# Patient Record
Sex: Male | Born: 1985 | Race: Black or African American | Hispanic: No | Smoking: Never smoker
Health system: Southern US, Community
[De-identification: ages and names within clinical notes are randomized; demographics above are authoritative.]

## PROBLEM LIST (undated history)

## (undated) DIAGNOSIS — R7611 Nonspecific reaction to tuberculin skin test without active tuberculosis: Secondary | ICD-10-CM

## (undated) HISTORY — DX: Nonspecific reaction to tuberculin skin test without active tuberculosis: R76.11

---

## 2015-07-30 ENCOUNTER — Telehealth: Payer: Self-pay | Admitting: Behavioral Health

## 2015-07-30 NOTE — Telephone Encounter (Signed)
Unable to reach patient at time of Pre-Visit Call.  Left message for patient to return call when available.    

## 2015-07-31 ENCOUNTER — Ambulatory Visit (INDEPENDENT_AMBULATORY_CARE_PROVIDER_SITE_OTHER): Payer: BLUE CROSS/BLUE SHIELD | Admitting: Medical

## 2015-07-31 ENCOUNTER — Encounter: Payer: Self-pay | Admitting: Medical

## 2015-07-31 ENCOUNTER — Telehealth: Payer: Self-pay | Admitting: Medical

## 2015-07-31 VITALS — BP 102/66 | HR 75 | Temp 98.0°F | Resp 16 | Ht 72.75 in | Wt 177.6 lb

## 2015-07-31 DIAGNOSIS — R7611 Nonspecific reaction to tuberculin skin test without active tuberculosis: Secondary | ICD-10-CM

## 2015-07-31 NOTE — Progress Notes (Signed)
   Subjective:    Patient ID: Jamie Hanson, male    DOB: 03/05/1985, 30 y.o.   MRN: [id number]  HPI  I have reviewed pt PMH, PSH, FH, Social History and Surgical History.   Pt states feels good. No complaints. Pt had positive ppd sept 2016. Pt started with isoniazid since October. Pt will be on for 9 months. [facility]. Pt chest xray was negative. Pt not symptomatic.  Pt states no recent pcp.  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 Pt works at [employer]. He is a Teacher, adult educationweb application developer, Pt works out plays soccer 3 times a week, Pt states moderate healthy diet, separated. No children.     Review of Systems  Constitutional: Negative for fever, chills and fatigue.  Respiratory: Negative for cough, chest tightness, shortness of breath and wheezing.   Cardiovascular: Negative for chest pain and palpitations.  Gastrointestinal: Negative for abdominal pain, blood in stool and anal bleeding.  Genitourinary: Negative for dysuria and flank pain.  Musculoskeletal: Negative for back pain and gait problem.  Skin: Negative for rash.  Neurological: Negative for dizziness and headaches.  Hematological: Negative for adenopathy. Does not bruise/bleed easily.  Psychiatric/Behavioral: Negative for behavioral problems and confusion.     History reviewed. No pertinent past medical history.   Social History   Social History  . Marital Status: Legally Separated    Spouse Name: N/A  . Number of Children: N/A  . Years of Education: N/A   Occupational History  . Not on file.   Social History Main Topics  . Smoking status: Never Smoker   . Smokeless tobacco: Never Used  . Alcohol Use: No  . Drug Use: No  . Sexual Activity: Not on file   Other Topics Concern  . Not on file   Social History Narrative  . No narrative on file    History reviewed. No pertinent past surgical  history.  History reviewed. No pertinent family history.  No Known Allergies  No current outpatient prescriptions on file prior to visit.   No current facility-administered medications on file prior to visit.    BP 102/66 mmHg  Pulse 75  Temp(Src) 98 F (36.7 C) (Oral)  Resp 16  Ht 6' 0.75" (1.848 m)  Wt 177 lb 9.6 oz (80.559 kg)  BMI 23.59 kg/m2  SpO2 99%        Objective:   Physical Exam  General Mental Status- Alert. General Appearance- Not in acute distress.    Neck Carotid Arteries- Normal color. Moisture- Normal Moisture. No carotid bruits. No JVD.  Chest and Lung Exam Auscultation: Breath Sounds:-Normal.  Cardiovascular Auscultation:Rythm- Regular. Murmurs & Other Heart Sounds:Auscultation of the heart reveals- No Murmurs.  Abdomen Inspection:-Inspeection Normal. Palpation/Percussion:Note:No mass. Palpation and Percussion of the abdomen reveal- Non Tender, Non Distended + BS, no rebound or guarding.    Neurologic Cranial Nerve exam:- CN III-XII intact(No nystagmus), symmetric smile. Strength:- 5/5 equal and symmetric strength both upper and lower extremities.      Assessment & Plan:  For your + ppd continue your isoniazid as healthy department has advised.  Regarding your history of vaccines will ask you sign release form and [facility] [place].   When you/we have vaccine records and if you need form filled out for college then schedule complete physical exam.  Follow up as needed

## 2015-07-31 NOTE — Patient Instructions (Signed)
For your + ppd continue your isoniazid as healthy department has advised.  Regarding your history of vaccines will ask you sign release form and [facility] a [facility] [place].   When you/we have vaccine records and if you need form filled out for college then schedule complete physical exam.  Follow up as needed

## 2015-07-31 NOTE — Telephone Encounter (Signed)
F[facility]

## 2015-07-31 NOTE — Progress Notes (Signed)
Pre visit review using our clinic review tool, if applicable. No additional management support is needed unless otherwise documented below in the visit note. 

## 2015-08-01 ENCOUNTER — Telehealth: Payer: Self-pay | Admitting: Medical

## 2015-08-01 NOTE — Telephone Encounter (Signed)
Left message on VM to follow up on New Patient appointment June 27. Welcomed patient to [facility] again and asked for a call back to inform us of his New Patient experience.

## 2015-08-08 NOTE — Telephone Encounter (Signed)
Relation to JY:NWGNpt:self Call back number:[phone] Pharmacy:  Reason for call:  Patient checking on status of immunization records from Clinic listed below. Patient will contact and have them fax records to [phone] Attention Saintclair HalstedJessica G.

## 2015-08-10 ENCOUNTER — Telehealth: Payer: Self-pay | Admitting: *Deleted

## 2015-08-10 NOTE — Telephone Encounter (Signed)
Forwarded to Edward/Holden. JG//CMA 

## 2015-08-14 ENCOUNTER — Telehealth: Payer: Self-pay | Admitting: Medical

## 2015-08-14 NOTE — Telephone Encounter (Signed)
I don't remember reviewing immunization records form . Would you help me find these records. Were they placed in by folders. I will check. But also did pt drop off form to be filled out. I need to review form and vaccines before I can say if he needs to come back in. If we can't find form touch base with Helaine ChessAshlee Langston and Amado CoeJessica Glover. Looks like they have talked with pt.

## 2015-08-14 NOTE — Telephone Encounter (Signed)
Please advise if you have received any paperwork.

## 2015-08-14 NOTE — Telephone Encounter (Signed)
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

## 2015-08-14 NOTE — Telephone Encounter (Signed)
Caller name: Lynnea FerrierSolomon  Relation to pt: Self Call back number: [phone] Pharmacy:  Reason for call: Pt stated is needing immunization records, pt mentioned that his records was sent from another office that he was seen at before [facility]) to our office and is needing copies of immunization for college. Pt will bring in also document during the wk, document is for college to be filled out by PCP. Pt requested if any other procedures needed to be done for document to be filled out to please inform him so he can make appt. Please advise.

## 2015-08-14 NOTE — Telephone Encounter (Signed)
Per chart, Immunization records were forwarded to [place] and his CMA, Francesco RunnerHolden.  Elveria RoyalsJessica A Glover, CMA at 08/10/2015 10:57 AM     Status: Signed       Expand All Collapse All   Forwarded to Luc[employer]/Holden. JG//CMA       Chiropodistmmunization Certificate received and forwarded to [place] to complete.

## 2015-08-15 NOTE — Telephone Encounter (Signed)
All forms were in providers folder for review. JG//CMA

## 2015-08-17 NOTE — Telephone Encounter (Signed)
Spoke with pt and he states that he will need the paperwork filled out before the middle of next week if possible. I informed the pt that we would do our best to get the paperwork completed as soon as possible and I would let him know if there was any more information needed at this time. Pt voices understanding.

## 2015-08-17 NOTE — Telephone Encounter (Signed)
Pt called today to verify status of documents. Pt would like to be called ASAP.

## 2015-08-21 NOTE — Telephone Encounter (Signed)
I spent quite some time going through all his records(spent time on Saturday tyring to figure it out). It took quite some time going through all his notes. I separtated sheets that had vaccines. He needs office visit. CPE type exam. We can explain what it appears is required from the school. If nothing was lacking documentation wise I would have simply filled out forms but that appears to not be the case.

## 2015-08-21 NOTE — Telephone Encounter (Signed)
Patient checking on the status of message below °

## 2015-08-21 NOTE — Telephone Encounter (Signed)
Appointment scheduled for patient tomorrow at 3:4281m.

## 2015-08-21 NOTE — Telephone Encounter (Signed)
Please advise 

## 2015-08-22 ENCOUNTER — Encounter: Payer: Self-pay | Admitting: Medical

## 2015-08-22 ENCOUNTER — Ambulatory Visit (INDEPENDENT_AMBULATORY_CARE_PROVIDER_SITE_OTHER): Payer: BLUE CROSS/BLUE SHIELD | Admitting: Medical

## 2015-08-22 VITALS — BP 100/68 | HR 69 | Temp 98.1°F | Ht 72.75 in | Wt 180.6 lb

## 2015-08-22 DIAGNOSIS — R75 Inconclusive laboratory evidence of human immunodeficiency virus [HIV]: Secondary | ICD-10-CM | POA: Diagnosis not present

## 2015-08-22 DIAGNOSIS — Z23 Encounter for immunization: Secondary | ICD-10-CM | POA: Diagnosis not present

## 2015-08-22 DIAGNOSIS — R1031 Right lower quadrant pain: Secondary | ICD-10-CM | POA: Diagnosis not present

## 2015-08-22 DIAGNOSIS — Z0000 Encounter for general adult medical examination without abnormal findings: Secondary | ICD-10-CM

## 2015-08-22 NOTE — Patient Instructions (Addendum)
For wellness exam I put in future lab order.Can get done this week.  We gave second MMR today to meet 2nd dose requirement.  You had tdap on 10-13-2014. So meet tetanus requirement despite no records of prior tetanus vaccine.  TB skin test info filled out. Recommend get letter from health dept on 08-25-2015. 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 Unclear if [employer] every followed through with referral to ID as they intended per records.  I am giving you copy of all records that I found with vaccine information.  Follow up date to be determined after lab review.   Preventive Care for Adults, Male A healthy lifestyle and preventive care can promote health and wellness. Preventive health guidelines for men include the following key practices:  A routine yearly physical is a good way to check with your health care provider about your health and preventative screening. It is a chance to share any concerns and updates on your health and to receive a thorough exam.  Visit your dentist for a routine exam and preventative care every 6 months. Brush your teeth twice a day and floss once a day. Good oral hygiene prevents tooth decay and gum disease.  The frequency of eye exams is based on your age, health, family medical history, use of contact lenses, and other factors. Follow your health care provider's recommendations for frequency of eye exams.  Eat a healthy diet. Foods such as vegetables, fruits, whole grains, low-fat dairy products, and lean protein foods contain the nutrients you need without too many calories. Decrease your intake of foods high in solid fats, added sugars, and salt. Eat the right amount of calories for you.Get information about a proper diet from your health care provider, if necessary.  Regular physical exercise is one of the most important things you can do  for your health. Most adults should get at least 150 minutes of moderate-intensity exercise (any activity that increases your heart rate and causes you to sweat) each week. In addition, most adults need muscle-strengthening exercises on 2 or more days a week.  Maintain a healthy weight. The body mass index (BMI) is a screening tool to identify possible weight problems. It provides an estimate of body fat based on height and weight. Your health care provider can find your BMI and can help you achieve or maintain a healthy weight.For adults 20 years and older:  A BMI below 18.5 is considered underweight.  A BMI of 18.5 to 24.9 is normal.  A BMI of 25 to 29.9 is considered overweight.  A BMI of 30 and above is considered obese.  Maintain normal blood lipids and cholesterol levels by exercising and minimizing your intake of saturated fat. Eat a balanced diet with plenty of fruit and vegetables. Blood tests for lipids and cholesterol should begin at age 53 and be repeated every 5 years. If your lipid or cholesterol levels are high, you are over 50, or you are at high risk for heart disease, you may need your cholesterol levels checked more frequently.Ongoing high lipid and cholesterol levels should be treated with medicines if diet and exercise are not working.  If you smoke, find out from your health care provider how to quit. If you do not use tobacco, do not start.  Lung cancer screening is recommended for adults aged 58-80 years who are at high risk for developing lung  cancer because of a history of smoking. A yearly low-dose CT scan of the lungs is recommended for people who have at least a 30-pack-year history of smoking and are a current smoker or have quit within the past 15 years. A pack year of smoking is smoking an average of 1 pack of cigarettes a day for 1 year (for example: 1 pack a day for 30 years or 2 packs a day for 15 years). Yearly screening should continue until the smoker has  stopped smoking for at least 15 years. Yearly screening should be stopped for people who develop a health problem that would prevent them from having lung cancer treatment.  If you choose to drink alcohol, do not have more than 2 drinks per day. One drink is considered to be 12 ounces (355 mL) of beer, 5 ounces (148 mL) of wine, or 1.5 ounces (44 mL) of liquor.  Avoid use of street drugs. Do not share needles with anyone. Ask for help if you need support or instructions about stopping the use of drugs.  High blood pressure causes heart disease and increases the risk of stroke. Your blood pressure should be checked at least every 1-2 years. Ongoing high blood pressure should be treated with medicines, if weight loss and exercise are not effective.  If you are 50-21 years old, ask your health care provider if you should take aspirin to prevent heart disease.  Diabetes screening is done by taking a blood sample to check your blood glucose level after you have not eaten for a certain period of time (fasting). If you are not overweight and you do not have risk factors for diabetes, you should be screened once every 3 years starting at age 21. If you are overweight or obese and you are 25-39 years of age, you should be screened for diabetes every year as part of your cardiovascular risk assessment.  Colorectal cancer can be detected and often prevented. Most routine colorectal cancer screening begins at the age of 80 and continues through age 42. However, your health care provider may recommend screening at an earlier age if you have risk factors for colon cancer. On a yearly basis, your health care provider may provide home test kits to check for hidden blood in the stool. Use of a small camera at the end of a tube to directly examine the colon (sigmoidoscopy or colonoscopy) can detect the earliest forms of colorectal cancer. Talk to your health care provider about this at age 84, when routine screening  begins. Direct exam of the colon should be repeated every 5-10 years through age 28, unless early forms of precancerous polyps or small growths are found.  People who are at an increased risk for hepatitis B should be screened for this virus. You are considered at high risk for hepatitis B if:  You were born in a country where hepatitis B occurs often. Talk with your health care provider about which countries are considered high risk.  Your parents were born in a high-risk country and you have not received a shot to protect against hepatitis B (hepatitis B vaccine).  You have HIV or AIDS.  You use needles to inject street drugs.  You live with, or have sex with, someone who has hepatitis B.  You are a man who has sex with other men (MSM).  You get hemodialysis treatment.  You take certain medicines for conditions such as cancer, organ transplantation, and autoimmune conditions.  Hepatitis C blood testing  is recommended for all people born from [birth year] through 1965 and any individual with known risks for hepatitis C.  Practice safe sex. Use condoms and avoid high-risk sexual practices to reduce the spread of sexually transmitted infections (STIs). STIs include gonorrhea, chlamydia, syphilis, trichomonas, herpes, HPV, and human immunodeficiency virus (HIV). Herpes, HIV, and HPV are viral illnesses that have no cure. They can result in disability, cancer, and death.  If you are a man who has sex with other men, you should be screened at least once per year for:  HIV.  Urethral, rectal, and pharyngeal infection of gonorrhea, chlamydia, or both.  If you are at risk of being infected with HIV, it is recommended that you take a prescription medicine daily to prevent HIV infection. This is called preexposure prophylaxis (PrEP). You are considered at risk if:  You are a man who has sex with other men (MSM) and have other risk factors.  You are a heterosexual man, are sexually active, and are at  increased risk for HIV infection.  You take drugs by injection.  You are sexually active with a partner who has HIV.  Talk with your health care provider about whether you are at high risk of being infected with HIV. If you choose to begin PrEP, you should first be tested for HIV. You should then be tested every 3 months for as long as you are taking PrEP.  A one-time screening for abdominal aortic aneurysm (AAA) and surgical repair of large AAAs by ultrasound are recommended for men ages 1 to 35 years who are current or former smokers.  Healthy men should no longer receive prostate-specific antigen (PSA) blood tests as part of routine cancer screening. Talk with your health care provider about prostate cancer screening.  Testicular cancer screening is not recommended for adult males who have no symptoms. Screening includes self-exam, a health care provider exam, and other screening tests. Consult with your health care provider about any symptoms you have or any concerns you have about testicular cancer.  Use sunscreen. Apply sunscreen liberally and repeatedly throughout the day. You should seek shade when your shadow is shorter than you. Protect yourself by wearing long sleeves, pants, a wide-brimmed hat, and sunglasses year round, whenever you are outdoors.  Once a month, do a whole-body skin exam, using a mirror to look at the skin on your back. Tell your health care provider about new moles, moles that have irregular borders, moles that are larger than a pencil eraser, or moles that have changed in shape or color.  Stay current with required vaccines (immunizations).  Influenza vaccine. All adults should be immunized every year.  Tetanus, diphtheria, and acellular pertussis (Td, Tdap) vaccine. An adult who has not previously received Tdap or who does not know his vaccine status should receive 1 dose of Tdap. This initial dose should be followed by tetanus and diphtheria toxoids (Td)  booster doses every 10 years. Adults with an unknown or incomplete history of completing a 3-dose immunization series with Td-containing vaccines should begin or complete a primary immunization series including a Tdap dose. Adults should receive a Td booster every 10 years.  Varicella vaccine. An adult without evidence of immunity to varicella should receive 2 doses or a second dose if he has previously received 1 dose.  Human papillomavirus (HPV) vaccine. Males aged 11-21 years who have not received the vaccine previously should receive the 3-dose series. Males aged 22-26 years may be immunized. Immunization is recommended through the  age of 46 years for any male who has sex with males and did not get any or all doses earlier. Immunization is recommended for any person with an immunocompromised condition through the age of 39 years if he did not get any or all doses earlier. During the 3-dose series, the second dose should be obtained 4-8 weeks after the first dose. The third dose should be obtained 24 weeks after the first dose and 16 weeks after the second dose.  Zoster vaccine. One dose is recommended for adults aged 76 years or older unless certain conditions are present.  Measles, mumps, and rubella (MMR) vaccine. Adults born before [birth year] generally are considered immune to measles and mumps. Adults born in [birth year] or later should have 1 or more doses of MMR vaccine unless there is a contraindication to the vaccine or there is laboratory evidence of immunity to each of the three diseases. A routine second dose of MMR vaccine should be obtained at least 28 days after the first dose for students attending postsecondary schools, health care workers, or international travelers. People who received inactivated measles vaccine or an unknown type of measles vaccine during 1963-1967 should receive 2 doses of MMR vaccine. People who received inactivated mumps vaccine or an unknown type of mumps vaccine before 1979  and are at high risk for mumps infection should consider immunization with 2 doses of MMR vaccine. Unvaccinated health care workers born before [birth year] who lack laboratory evidence of measles, mumps, or rubella immunity or laboratory confirmation of disease should consider measles and mumps immunization with 2 doses of MMR vaccine or rubella immunization with 1 dose of MMR vaccine.  Pneumococcal 13-valent conjugate (PCV13) vaccine. When indicated, a person who is uncertain of his immunization history and has no record of immunization should receive the PCV13 vaccine. All adults 7 years of age and older should receive this vaccine. An adult aged 34 years or older who has certain medical conditions and has not been previously immunized should receive 1 dose of PCV13 vaccine. This PCV13 should be followed with a dose of pneumococcal polysaccharide (PPSV23) vaccine. Adults who are at high risk for pneumococcal disease should obtain the PPSV23 vaccine at least 8 weeks after the dose of PCV13 vaccine. Adults older than 28 years of age who have normal immune system function should obtain the PPSV23 vaccine dose at least 1 year after the dose of PCV13 vaccine.  Pneumococcal polysaccharide (PPSV23) vaccine. When PCV13 is also indicated, PCV13 should be obtained first. All adults aged 56 years and older should be immunized. An adult younger than age 58 years who has certain medical conditions should be immunized. Any person who resides in a nursing home or long-term care facility should be immunized. An adult smoker should be immunized. People with an immunocompromised condition and certain other conditions should receive both PCV13 and PPSV23 vaccines. People with human immunodeficiency virus (HIV) infection should be immunized as soon as possible after diagnosis. Immunization during chemotherapy or radiation therapy should be avoided. Routine use of PPSV23 vaccine is not recommended for American Indians, [place] Natives,  or people younger than 65 years unless there are medical conditions that require PPSV23 vaccine. When indicated, people who have unknown immunization and have no record of immunization should receive PPSV23 vaccine. One-time revaccination 5 years after the first dose of PPSV23 is recommended for people aged 19-64 years who have chronic kidney failure, nephrotic syndrome, asplenia, or immunocompromised conditions. People who received 1-2 doses of PPSV23 before age 61  years should receive another dose of PPSV23 vaccine at age 54 years or later if at least 5 years have passed since the previous dose. Doses of PPSV23 are not needed for people immunized with PPSV23 at or after age 75 years.  Meningococcal vaccine. Adults with asplenia or persistent complement component deficiencies should receive 2 doses of quadrivalent meningococcal conjugate (MenACWY-D) vaccine. The doses should be obtained at least 2 months apart. Microbiologists working with certain meningococcal bacteria, [place] recruits, people at risk during an outbreak, and people who travel to or live in countries with a high rate of meningitis should be immunized. A first-year college student up through age 50 years who is living in a residence hall should receive a dose if he did not receive a dose on or after his 16th birthday. Adults who have certain high-risk conditions should receive one or more doses of vaccine.  Hepatitis A vaccine. Adults who wish to be protected from this disease, have chronic liver disease, work with hepatitis A-infected animals, work in hepatitis A research labs, or travel to or work in countries with a high rate of hepatitis A should be immunized. Adults who were previously unvaccinated and who anticipate close contact with an international adoptee during the first 60 days after arrival in the [place] from a country with a high rate of hepatitis A should be immunized.  Hepatitis B vaccine. Adults should be immunized if  they wish to be protected from this disease, are under age 35 years and have diabetes, have chronic liver disease, have had more than one sex partner in the past 6 months, may be exposed to blood or other infectious body fluids, are household contacts or sex partners of hepatitis B positive people, are clients or workers in certain care facilities, or travel to or work in countries with a high rate of hepatitis B.  Haemophilus influenzae type b (Hib) vaccine. A previously unvaccinated person with asplenia or sickle cell disease or having a scheduled splenectomy should receive 1 dose of Hib vaccine. Regardless of previous immunization, a recipient of a hematopoietic stem cell transplant should receive a 3-dose series 6-12 months after his successful transplant. Hib vaccine is not recommended for adults with HIV infection. Preventive Service / Frequency Ages 57 to 102  Blood pressure check.** / Every 3-5 years.  Lipid and cholesterol check.** / Every 5 years beginning at age 28.  Hepatitis C blood test.** / For any individual with known risks for hepatitis C.  Skin self-exam. / Monthly.  Influenza vaccine. / Every year.  Tetanus, diphtheria, and acellular pertussis (Tdap, Td) vaccine.** / Consult your health care provider. 1 dose of Td every 10 years.  Varicella vaccine.** / Consult your health care provider.  HPV vaccine. / 3 doses over 6 months, if 65 or younger.  Measles, mumps, rubella (MMR) vaccine.** / You need at least 1 dose of MMR if you were born in 1957 or later. You may also need a second dose.  Pneumococcal 13-valent conjugate (PCV13) vaccine.** / Consult your health care provider.  Pneumococcal polysaccharide (PPSV23) vaccine.** / 1 to 2 doses if you smoke cigarettes or if you have certain conditions.  Meningococcal vaccine.** / 1 dose if you are age 7 to 77 years and a [employer] researcher living in a residence hall, or have one of several medical conditions. You may  also need additional booster doses.  Hepatitis A vaccine.** / Consult your health care provider.  Hepatitis B vaccine.** / Consult your health  care provider.  Haemophilus influenzae type b (Hib) vaccine.** / Consult your health care provider. Ages 42 to 41  Blood pressure check.** / Every year.  Lipid and cholesterol check.** / Every 5 years beginning at age 59.  Lung cancer screening. / Every year if you are aged 20-80 years and have a 30-pack-year history of smoking and currently smoke or have quit within the past 15 years. Yearly screening is stopped once you have quit smoking for at least 15 years or develop a health problem that would prevent you from having lung cancer treatment.  Fecal occult blood test (FOBT) of stool. / Every year beginning at age 55 and continuing until age 44. You may not have to do this test if you get a colonoscopy every 10 years.  Flexible sigmoidoscopy** or colonoscopy.** / Every 5 years for a flexible sigmoidoscopy or every 10 years for a colonoscopy beginning at age 72 and continuing until age 45.  Hepatitis C blood test.** / For all people born from [birth year] through 1965 and any individual with known risks for hepatitis C.  Skin self-exam. / Monthly.  Influenza vaccine. / Every year.  Tetanus, diphtheria, and acellular pertussis (Tdap/Td) vaccine.** / Consult your health care provider. 1 dose of Td every 10 years.  Varicella vaccine.** / Consult your health care provider.  Zoster vaccine.** / 1 dose for adults aged 71 years or older.  Measles, mumps, rubella (MMR) vaccine.** / You need at least 1 dose of MMR if you were born in 1955 or later. You may also need a second dose.  Pneumococcal 13-valent conjugate (PCV13) vaccine.** / Consult your health care provider.  Pneumococcal polysaccharide (PPSV23) vaccine.** / 1 to 2 doses if you smoke cigarettes or if you have certain conditions.  Meningococcal vaccine.** / Consult your health care  provider.  Hepatitis A vaccine.** / Consult your health care provider.  Hepatitis B vaccine.** / Consult your health care provider.  Haemophilus influenzae type b (Hib) vaccine.** / Consult your health care provider. Ages 59 and over  Blood pressure check.** / Every year.  Lipid and cholesterol check.**/ Every 5 years beginning at age 61.  Lung cancer screening. / Every year if you are aged 13-80 years and have a 30-pack-year history of smoking and currently smoke or have quit within the past 15 years. Yearly screening is stopped once you have quit smoking for at least 15 years or develop a health problem that would prevent you from having lung cancer treatment.  Fecal occult blood test (FOBT) of stool. / Every year beginning at age 57 and continuing until age 57. You may not have to do this test if you get a colonoscopy every 10 years.  Flexible sigmoidoscopy** or colonoscopy.** / Every 5 years for a flexible sigmoidoscopy or every 10 years for a colonoscopy beginning at age 46 and continuing until age 29.  Hepatitis C blood test.** / For all people born from [birth year] through 1963 and any individual with known risks for hepatitis C.  Abdominal aortic aneurysm (AAA) screening.** / A one-time screening for ages 82 to 55 years who are current or former smokers.  Skin self-exam. / Monthly.  Influenza vaccine. / Every year.  Tetanus, diphtheria, and acellular pertussis (Tdap/Td) vaccine.** / 1 dose of Td every 10 years.  Varicella vaccine.** / Consult your health care provider.  Zoster vaccine.** / 1 dose for adults aged 57 years or older.  Pneumococcal 13-valent conjugate (PCV13) vaccine.** / 1 dose for all adults aged 61  years and older.  Pneumococcal polysaccharide (PPSV23) vaccine.** / 1 dose for all adults aged 19 years and older.  Meningococcal vaccine.** / Consult your health care provider.  Hepatitis A vaccine.** / Consult your health care provider.  Hepatitis B vaccine.** /  Consult your health care provider.  Haemophilus influenzae type b (Hib) vaccine.** / Consult your health care provider. **Family history and personal history of risk and conditions may change your health care provider's recommendations.   This information is not intended to replace advice given to you by your health care provider. Make sure you discuss any questions you have with your health care provider.   Document Released: 03/18/2001 Document Revised: 02/10/2014 Document Reviewed: 06/17/2010 Elsevier Interactive Patient Education [employer].

## 2015-08-22 NOTE — Progress Notes (Signed)
Subjective:    Patient ID: Jamie Hanson, male    DOB: February 10, 1985, 30 y.o.   MRN: [id number]  HPI  Pt in for physical.   He needs form filled out for college. On review old records.  Pt had false positive hiv test in the past. He had western blot test was negative. Pt was told false positive from a lot vaccines secondary to vaccines he was given. It looks like military intended to refer to infectious disease. But not clear if that happened.  I reviewed vaccine records and limited military records although a lot of records sent over.Only one mmr vaccine seen.   Required vaccine for A and T certificate is 2 mmr or positive titer.   Review of Systems  Constitutional: Negative for fever, chills and fatigue.  Respiratory: Negative for cough, chest tightness, shortness of breath and wheezing.   Cardiovascular: Negative for chest pain and palpitations.  Gastrointestinal: Negative for vomiting, abdominal pain, diarrhea and constipation.  Musculoskeletal: Negative for myalgias, back pain and neck pain.       Rt groin pain on exercise  Skin: Negative for rash.  Neurological: Negative for dizziness and light-headedness.  Hematological: Negative for adenopathy. Does not bruise/bleed easily.  Psychiatric/Behavioral: Negative for behavioral problems and confusion.    Past Medical History  Diagnosis Date  . PPD positive, treated      Social History   Social History  . Marital Status: Legally Separated    Spouse Name: N/A  . Number of Children: N/A  . Years of Education: N/A   Occupational History  . Not on file.   Social History Main Topics  . Smoking status: Never Smoker   . Smokeless tobacco: Never Used  . Alcohol Use: No  . Drug Use: No  . Sexual Activity: No   Other Topics Concern  . Not on file   Social History Narrative    History reviewed. No pertinent past surgical history.  Family History  Problem Relation Age of Onset  . Hypertension Father   . Diabetes  Father   . Stroke Father     No Known Allergies  Current Outpatient Prescriptions on File Prior to Visit  Medication Sig Dispense Refill  . b complex vitamins tablet Take 1 tablet by mouth daily.    Marland Kitchen isoniazid (NYDRAZID) 300 MG tablet Take 300 mg by mouth daily.     No current facility-administered medications on file prior to visit.    BP 100/68 mmHg  Pulse 69  Temp(Src) 98.1 F (36.7 C) (Oral)  Ht 6' 0.75" (1.848 m)  Wt 180 lb 9.6 oz (81.92 kg)  BMI 23.99 kg/m2  SpO2 97%       Objective:   Physical Exam  General Mental Status- Alert. General Appearance- Not in acute distress.   Skin General: Color- Normal Color. Moisture- Normal Moisture.  Neck Carotid Arteries- Normal color. Moisture- Normal Moisture. No carotid bruits. No JVD.  Chest and Lung Exam Auscultation: Breath Sounds:-Normal.  Cardiovascular Auscultation:Rythm- Regular. Murmurs & Other Heart Sounds:Auscultation of the heart reveals- No Murmurs.  Abdomen Inspection:-Inspeection Normal. Palpation/Percussion:Note:No mass. Palpation and Percussion of the abdomen reveal- Non Tender, Non Distended + BS, no rebound or guarding.   Neurologic Cranial Nerve exam:- CN III-XII intact(No nystagmus), symmetric smile. Strength:- 5/5 equal and symmetric strength both upper and lower extremities.  Genital exam- no obvious hernia on exam. But high in rt inguinal canal possible defect in canal.      Assessment & Plan:  For  wellness exam I put in future lab order.Can get done this week.  We gave second MMR today to meet 2nd dose requirement.  You had tdap on 10-13-2014. So meet tetanus requirement despite no records of prior tetanus vaccine.  TB skin test info filled out. Recommend get letter from health dept on 08-25-2015. 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 Unclear if military every  followed through with referral to ID as they intended per records.  I am giving you copy of all records that I found with vaccine information.  Follow up date to be determined after lab review.  Marquies Wanat, Percell Miller, PA-C

## 2015-08-22 NOTE — Addendum Note (Signed)
Addended by: Neldon LabellaMABE, Kamya Watling S on: 08/22/2015 07:11 PM   Modules accepted: Kipp BroodSmartSet

## 2015-08-22 NOTE — Addendum Note (Signed)
Addended by: Neldon LabellaMABE, Virtie Bungert S on: 08/22/2015 06:49 PM   Modules accepted: Kipp BroodSmartSet

## 2015-08-22 NOTE — Addendum Note (Signed)
Addended by: Neldon LabellaMABE, HOLDEN S on: 08/22/2015 06:43 PM   Modules accepted: Orders, SmartSet

## 2015-08-22 NOTE — Progress Notes (Signed)
Pre visit review using our clinic review tool, if applicable. No additional management support is needed unless otherwise documented below in the visit note. 

## 2015-08-29 ENCOUNTER — Telehealth: Payer: Self-pay | Admitting: Medical

## 2015-08-29 NOTE — Telephone Encounter (Signed)
Billing/coding had a question on my coding. Mentioned based on documentation I could code higher. Looks like pt does not have insurance. Is that the case? Wanted to know this before making adjustment on coding for this visit.

## 2015-08-31 ENCOUNTER — Telehealth: Payer: Self-pay | Admitting: Medical

## 2015-08-31 NOTE — Telephone Encounter (Signed)
Patient has Jamie Hanson

## 2015-08-31 NOTE — Telephone Encounter (Signed)
Ok if bcbs then have billing charge whatever they think appropiate.

## 2015-09-03 MED ORDER — CLOTRIMAZOLE-BETAMETHASONE 1-0.05 % EX CREA: 1.0000 "application " | TOPICAL_CREAM | Freq: Two times a day (BID) | CUTANEOUS | 0 refills | Status: DC

## 2015-09-03 NOTE — Telephone Encounter (Signed)
Nurse from [facility] called patient is have continued redness on his bottom wants order for Lotrifone fungal cream for his leg she thinks that will cut down on any fungus he has growing there.  Patient uses [facility]  on [address] in [place]   [phone] 

## 2015-09-03 NOTE — Telephone Encounter (Signed)
rx sent

## 2015-09-04 DIAGNOSIS — R7611 Nonspecific reaction to tuberculin skin test without active tuberculosis: Secondary | ICD-10-CM

## 2015-09-04 HISTORY — DX: Nonspecific reaction to tuberculin skin test without active tuberculosis: R76.11

## 2015-09-10 ENCOUNTER — Ambulatory Visit (HOSPITAL_BASED_OUTPATIENT_CLINIC_OR_DEPARTMENT_OTHER)
Admission: RE | Admit: 2015-09-10 | Discharge: 2015-09-10 | Disposition: A | Discharge status: Home / Self Care | Payer: BLUE CROSS/BLUE SHIELD | Source: Ambulatory Visit | Attending: Medical | Admitting: Medical

## 2015-09-10 DIAGNOSIS — R1031 Right lower quadrant pain: Secondary | ICD-10-CM

## 2015-09-10 MED ORDER — IOPAMIDOL (ISOVUE-300) INJECTION 61%
100.0000 mL | Freq: Once | INTRAVENOUS | Status: AC | PRN
  Administered 2015-09-10: 100 mL via INTRAVENOUS

## 2015-09-10 NOTE — Addendum Note (Signed)
Addended by: Gwenevere AbbotSAGUIER, Azizah Lisle M on: 09/10/2015 08:35 AM   Modules accepted: Orders

## 2015-09-10 NOTE — Addendum Note (Signed)
Addended by: Neldon LabellaMABE, HOLDEN S on: 09/10/2015 03:19 PM   Modules accepted: Orders

## 2015-09-11 ENCOUNTER — Encounter: Payer: Self-pay | Admitting: Medical

## 2015-09-13 ENCOUNTER — Other Ambulatory Visit: Payer: BLUE CROSS/BLUE SHIELD

## 2015-09-13 ENCOUNTER — Other Ambulatory Visit (HOSPITAL_COMMUNITY)
Admission: RE | Admit: 2015-09-13 | Discharge: 2015-09-13 | Disposition: A | Discharge status: Home / Self Care | Payer: BLUE CROSS/BLUE SHIELD | Source: Ambulatory Visit | Attending: Internal Medicine | Admitting: Internal Medicine

## 2015-09-13 DIAGNOSIS — B20 Human immunodeficiency virus [HIV] disease: Secondary | ICD-10-CM

## 2015-09-13 DIAGNOSIS — Z113 Encounter for screening for infections with a predominantly sexual mode of transmission: Secondary | ICD-10-CM | POA: Insufficient documentation

## 2015-09-13 DIAGNOSIS — Z79899 Other long term (current) drug therapy: Secondary | ICD-10-CM

## 2015-09-13 LAB — CBC WITH DIFFERENTIAL/PLATELET
BASOS PCT: 0 %
Basophils Absolute: 0 cells/uL (ref 0–200)
EOS PCT: 5 %
Eosinophils Absolute: 255 cells/uL (ref 15–500)
HCT: 42.2 % (ref 38.5–50.0)
Hemoglobin: 13.9 g/dL (ref 13.2–17.1)
Lymphocytes Relative: 32 %
Lymphs Abs: 1632 cells/uL (ref 850–3900)
MCH: 30.3 pg (ref 27.0–33.0)
MCHC: 32.9 g/dL (ref 32.0–36.0)
MCV: 91.9 fL (ref 80.0–100.0)
MONOS PCT: 9 %
MPV: 11 fL (ref 7.5–12.5)
Monocytes Absolute: 459 cells/uL (ref 200–950)
NEUTROS ABS: 2754 {cells}/uL (ref 1500–7800)
Neutrophils Relative %: 54 %
PLATELETS: 212 10*3/uL (ref 140–400)
RBC: 4.59 MIL/uL (ref 4.20–5.80)
RDW: 12 % (ref 11.0–15.0)
WBC: 5.1 10*3/uL (ref 3.8–10.8)

## 2015-09-13 LAB — COMPLETE METABOLIC PANEL WITH GFR
ALT: 12 U/L (ref 9–46)
AST: 19 U/L (ref 10–40)
Albumin: 4.5 g/dL (ref 3.6–5.1)
Alkaline Phosphatase: 57 U/L (ref 40–115)
BILIRUBIN TOTAL: 1.5 mg/dL — AB (ref 0.2–1.2)
BUN: 9 mg/dL (ref 7–25)
CO2: 30 mmol/L (ref 20–31)
CREATININE: 0.98 mg/dL (ref 0.60–1.35)
Calcium: 9.6 mg/dL (ref 8.6–10.3)
Chloride: 100 mmol/L (ref 98–110)
GFR, Est African American: >89 mL/min (ref >=60)
GLUCOSE: 89 mg/dL (ref 65–99)
Potassium: 4.2 mmol/L (ref 3.5–5.3)
SODIUM: 137 mmol/L (ref 135–146)
TOTAL PROTEIN: 7.1 g/dL (ref 6.1–8.1)

## 2015-09-13 LAB — LIPID PANEL
CHOL/HDL RATIO: 2.2 ratio (ref <=5.0)
CHOLESTEROL: 121 mg/dL — AB (ref 125–200)
HDL: 56 mg/dL (ref >=40)
LDL Cholesterol: 52 mg/dL (ref <130)
Triglycerides: 66 mg/dL (ref <150)
VLDL: 13 mg/dL (ref <30)

## 2015-09-13 NOTE — Addendum Note (Signed)
Addended by: Mariea ClontsGREEN, Marcelline Temkin D on: 09/13/2015 03:33 PM   Modules accepted: Orders

## 2015-09-14 LAB — HEPATITIS B SURFACE ANTIBODY,QUALITATIVE: HEP B S AB: POSITIVE — AB

## 2015-09-14 LAB — T-HELPER CELL (CD4) - (RCID CLINIC ONLY)
CD4 % Helper T Cell: 40 % (ref 33–55)
CD4 T Cell Abs: 660 /uL (ref 400–2700)

## 2015-09-14 LAB — RPR

## 2015-09-14 LAB — URINALYSIS
Bilirubin Urine: NEGATIVE
GLUCOSE, UA: NEGATIVE
HGB URINE DIPSTICK: NEGATIVE
KETONES UR: NEGATIVE
Leukocytes, UA: NEGATIVE
Nitrite: NEGATIVE
PH: 6 (ref 5.0–8.0)
PROTEIN: NEGATIVE
SPECIFIC GRAVITY, URINE: 1.025 (ref 1.001–1.035)

## 2015-09-14 LAB — QUANTIFERON TB GOLD ASSAY (BLOOD)
Interferon Gamma Release Assay: NEGATIVE
MITOGEN-NIL SO: 4.46 [IU]/mL
QUANTIFERON NIL VALUE: 0.02 [IU]/mL
Quantiferon Tb Ag Minus Nil Value: <0 IU/mL

## 2015-09-14 LAB — HEPATITIS C ANTIBODY: HCV Ab: NEGATIVE

## 2015-09-14 LAB — URINE CYTOLOGY ANCILLARY ONLY
Chlamydia: NEGATIVE
Neisseria Gonorrhea: NEGATIVE

## 2015-09-14 LAB — HEPATITIS B CORE ANTIBODY, TOTAL: Hep B Core Total Ab: NONREACTIVE

## 2015-09-14 LAB — HEPATITIS B SURFACE ANTIGEN: Hepatitis B Surface Ag: NEGATIVE

## 2015-09-14 LAB — HIV ANTIBODY (ROUTINE TESTING W REFLEX): HIV: NONREACTIVE

## 2015-09-14 LAB — HEPATITIS A ANTIBODY, TOTAL: HEP A TOTAL AB: REACTIVE — AB

## 2015-09-17 LAB — HIV-1 RNA ULTRAQUANT REFLEX TO GENTYP+
HIV 1 RNA Quant: <20 {copies}/mL
HIV-1 RNA Quant, Log: <1.3 {Log_copies}/mL

## 2015-09-21 ENCOUNTER — Encounter: Payer: Self-pay | Admitting: Medical

## 2015-09-21 LAB — HLA B*5701: HLA-B*5701 w/rflx HLA-B High: NEGATIVE

## 2015-09-24 NOTE — Telephone Encounter (Signed)
Pt came in office and dropped off paperwork to be filled out (Document is [employer] Statement for Enrollment) Documents in a black folder, put at front office desk tray.

## 2015-10-01 ENCOUNTER — Ambulatory Visit (INDEPENDENT_AMBULATORY_CARE_PROVIDER_SITE_OTHER): Payer: BLUE CROSS/BLUE SHIELD | Admitting: Infectious Diseases

## 2015-10-01 ENCOUNTER — Encounter: Payer: Self-pay | Admitting: Infectious Diseases

## 2015-10-01 VITALS — BP 133/75 | HR 69 | Wt 181.0 lb

## 2015-10-01 DIAGNOSIS — Z789 Other specified health status: Secondary | ICD-10-CM | POA: Diagnosis not present

## 2015-10-01 DIAGNOSIS — Z21 Asymptomatic human immunodeficiency virus [HIV] infection status: Secondary | ICD-10-CM | POA: Diagnosis not present

## 2015-10-01 NOTE — Progress Notes (Signed)
   Subjective:    Patient ID: Jamie Hanson, male    DOB: 05/18/1985, 30 y.o.   MRN: [id number]  HPI 30 yo [place] born in [place], in [place] since [place], with hx of HIV+ screening test in December 2016.  This was done as part of arm services, blood donation exam.  He feels well.  No hx of STI.  Divorced. Not currently sexually active.   The past medical history, family history and social history were reviewed/updated in [employer] Mother died from complications of childbirth.   Review of Systems  Constitutional: Negative for appetite change and unexpected weight change.  HENT: Negative for mouth sores and trouble swallowing.   Respiratory: Negative for cough and shortness of breath.   Gastrointestinal: Negative for constipation and diarrhea.  Genitourinary: Negative for difficulty urinating, dysuria and genital sores.  Hematological: Negative for adenopathy.       Objective:   Physical Exam  Constitutional: He appears well-developed and well-nourished.  HENT:  Mouth/Throat: No oropharyngeal exudate.  Eyes: EOM are normal. Pupils are equal, round, and reactive to light.  Neck: Neck supple.  Cardiovascular: Normal rate, regular rhythm and normal heart sounds.   Pulmonary/Chest: Effort normal and breath sounds normal.  Abdominal: Soft. Bowel sounds are normal. There is no tenderness. There is no rebound.  Musculoskeletal: He exhibits no edema.  Lymphadenopathy:    He has no cervical adenopathy.    He has no axillary adenopathy.       Assessment & Plan:

## 2015-10-01 NOTE — Assessment & Plan Note (Signed)
He had a false positive screening test.  His viral load is negative as are all of his other tests.  He has no current risk factors for hiv. He denies having a current partner.   I encouraged him to exercise, eat healthily, limit ETOH use, use condoms, wear his seat belt, avoid drugs.

## 2015-10-02 NOTE — Telephone Encounter (Signed)
Patient called to follow up on form that was left to be completed.

## 2015-10-08 NOTE — Telephone Encounter (Signed)
I filled out pt form this weekend. When I did ,I saw that he had sent some my chart messages. I never saw these. This is not the first time that I have not seen some my chart messages. In fact since the upgrade I have been getting very few messages. I saw that this pt was very upset. I just got update from infectious disease and filled out paperwork. He can pick this up. But he may have already gotten a new provider. Let my know what comes of this. Please apologize on my behalf. And will you contact IT for me. Either I am not getting messages or I don't know how to access. But it was quite obvious on old system.

## 2015-10-09 ENCOUNTER — Encounter: Payer: Self-pay | Admitting: Licensed Clinical Social Worker

## 2015-10-12 NOTE — Telephone Encounter (Addendum)
Late Entry:  10/10/15 Form picked up by patient.  He was still a little upset, but said there was nothing we could do.  He said he would just look for the next opportunity to enroll in program.  No further questions or concerns voiced.

## 2015-12-31 ENCOUNTER — Encounter: Payer: Self-pay | Admitting: Medical

## 2015-12-31 NOTE — Telephone Encounter (Signed)
Pt had old vaccine records from [employer]. Those you can find in 09-13-2015 notes filed to media. Will you print those and give him copy of all sheets that show vaccines. 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 Also Give him list of what is in our immunization section. I want him to have all of those records to show. Since he records were scattered in his [employer] files.   Also document in computer that he states got flu vaccine recently. Please document that.

## 2016-01-03 NOTE — Telephone Encounter (Signed)
Immunization records printed.  Pt notified and made aware that forms available for pick up.  Pt stated he could come by around 1:30 pm today.  Flu vaccine documented in chart.

## 2016-05-28 ENCOUNTER — Encounter: Payer: Self-pay | Admitting: Medical

## 2017-03-21 IMAGING — CT CT ABD-PELV W/ CM
2 of 4 series · 16 of 46 positions shown, 18 images · IV contrast (APPLIED)
Comparison: None.

CLINICAL DATA: Right-sided groin pain, right lower quadrant pain
for several months, worse after playing soccer

EXAM:
CT ABDOMEN AND PELVIS WITH CONTRAST
TECHNIQUE: Multidetector CT imaging of the abdomen and pelvis was performed
using the standard protocol following bolus administration of
intravenous contrast.
CONTRAST:  100mL 0IQBAB-877 IOPAMIDOL ([id number]-877) INJECTION 61%

[Series 2: axial st · axial · 0.77mm/px · z∈[-596,-126]mm · 13 of 104 slices shown, 15 images]
[im 5/104  soft-tissue]
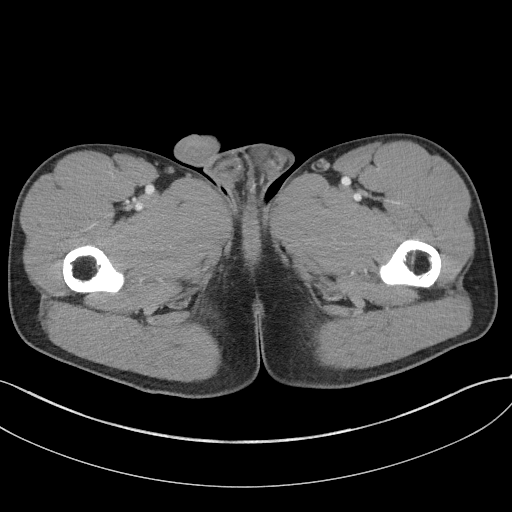
[im 5/104  bone]
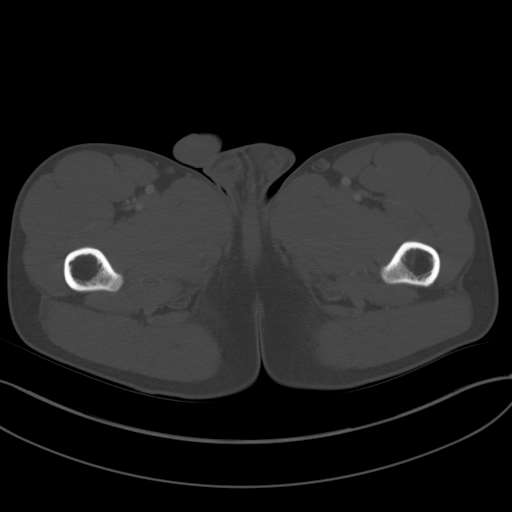
[im 15/104  soft-tissue]
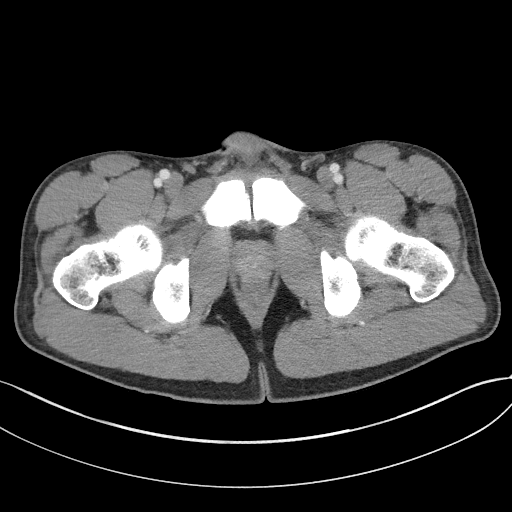
[im 24/104  soft-tissue]
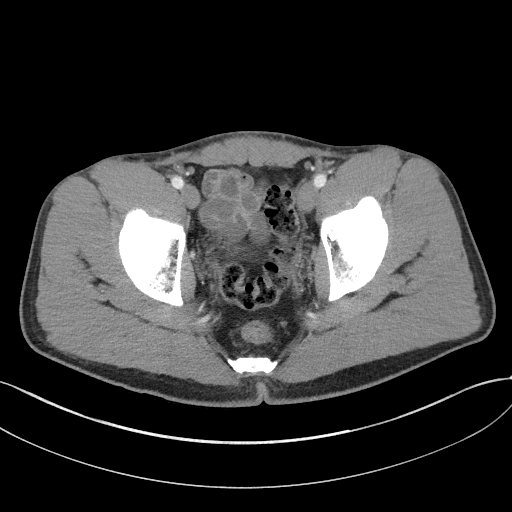
[im 29/104  soft-tissue]
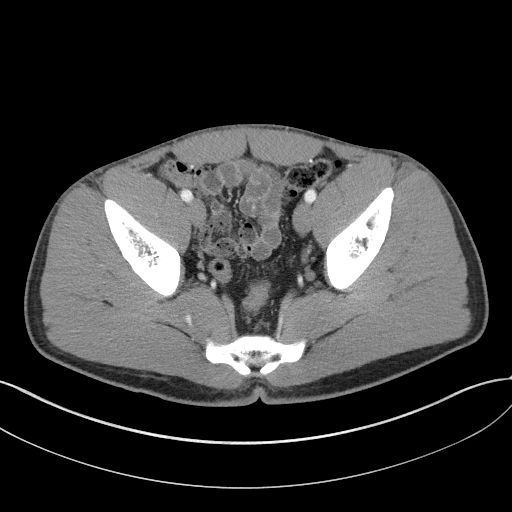
[im 38/104  soft-tissue]
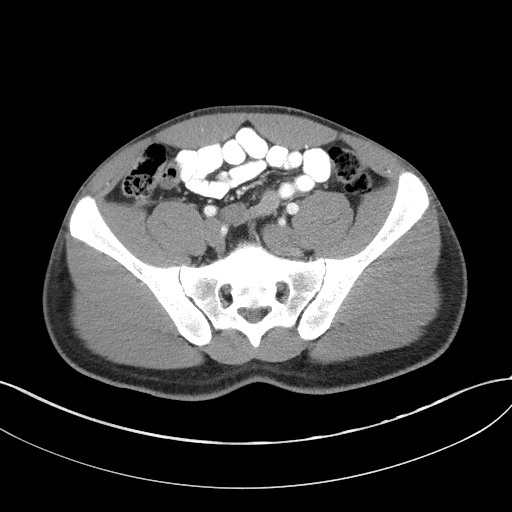
[im 43/104  soft-tissue]
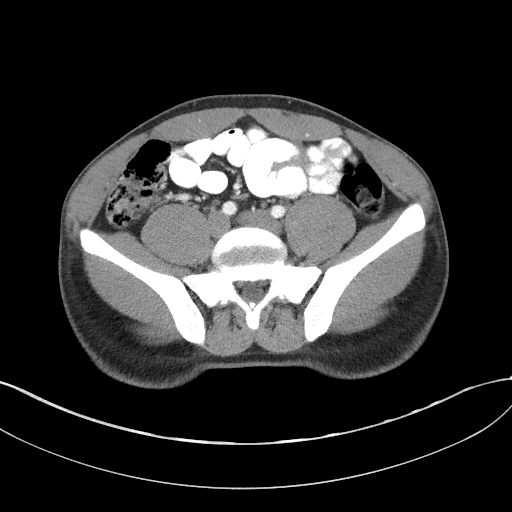
[im 52/104  soft-tissue]
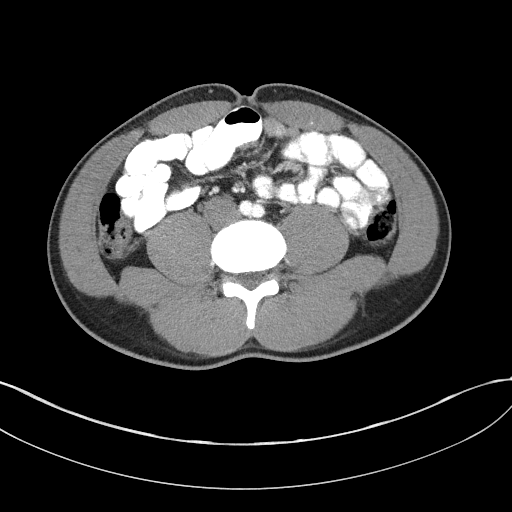
[im 61/104  soft-tissue]
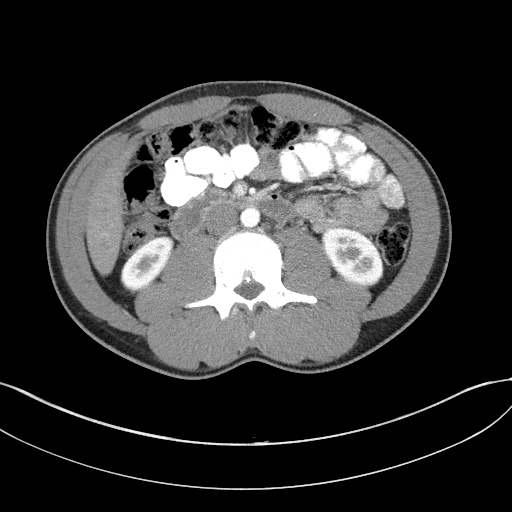
[im 66/104  soft-tissue]
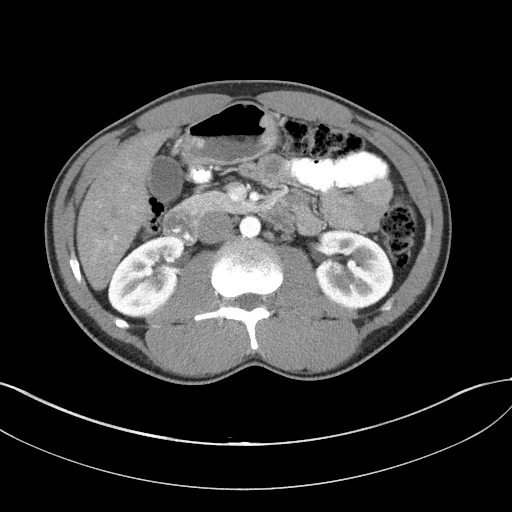
[im 66/104  bone]
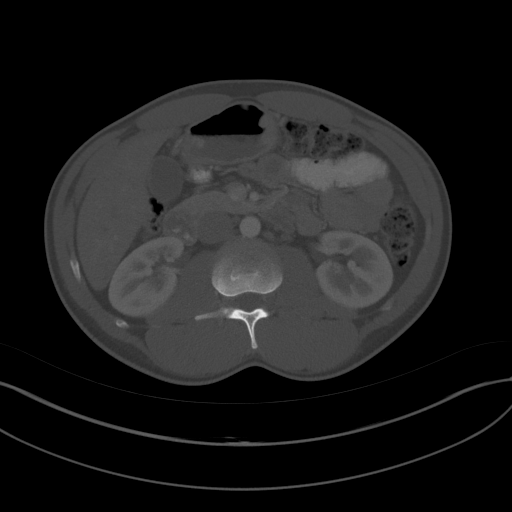
[im 75/104  soft-tissue]
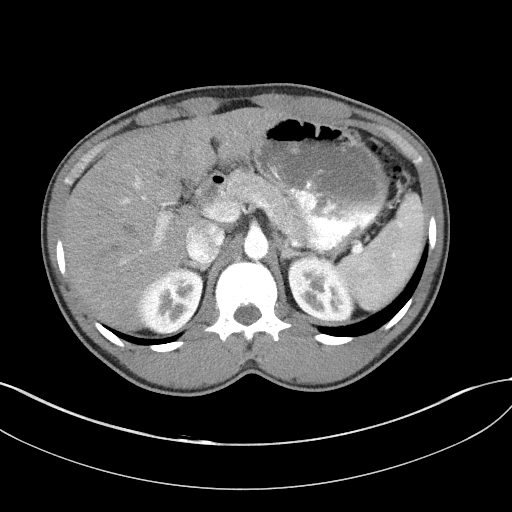
[im 80/104  soft-tissue]
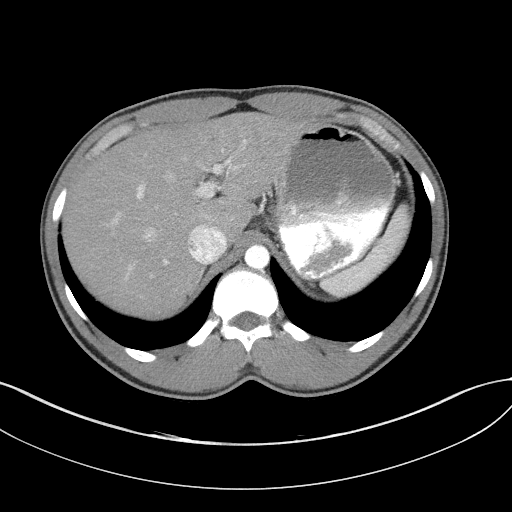
[im 89/104  soft-tissue]
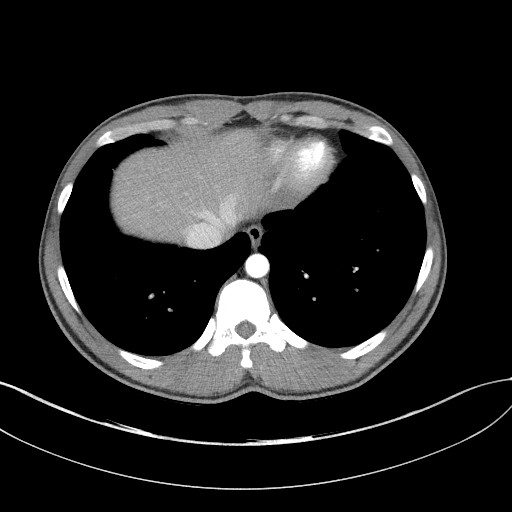
[im 99/104  soft-tissue]
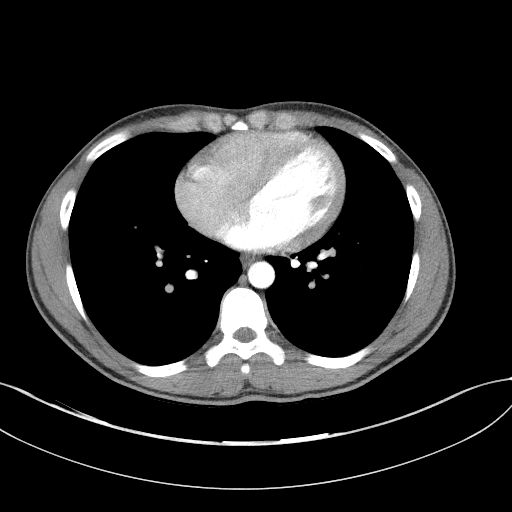

[Series 5: coronal st · coronal · 0.80mm/px · 3 of 101 slices shown]
[im 34/101  soft-tissue]
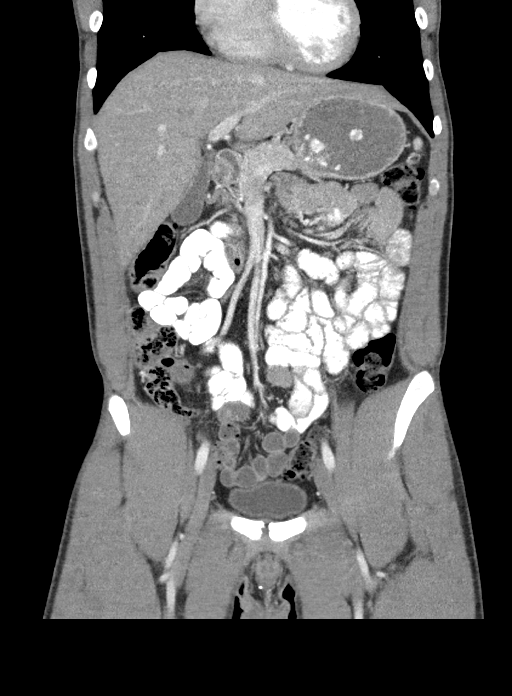
[im 45/101  soft-tissue]
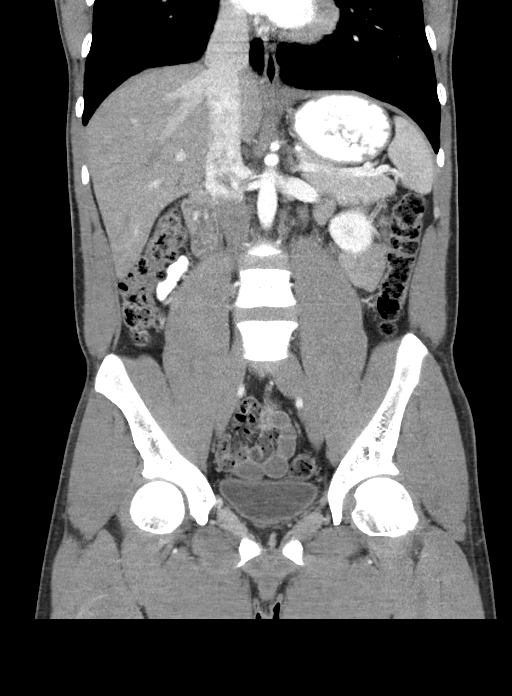
[im 56/101  soft-tissue]
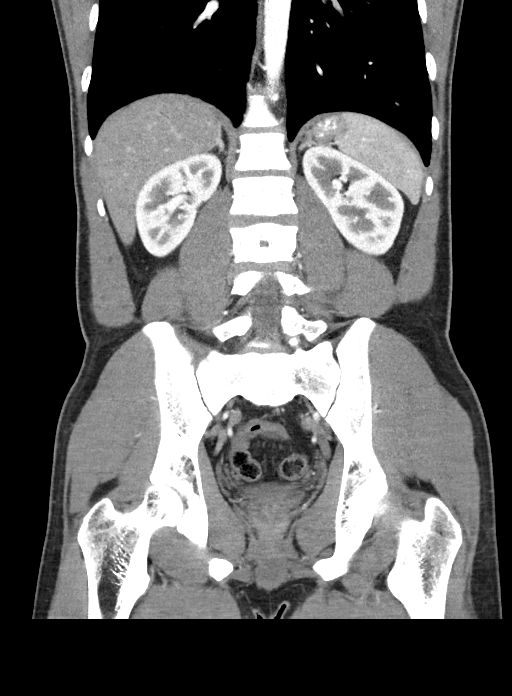

[16 of 46 positions shown; findings below may reference images not displayed]

FINDINGS: The lung bases are clear. The liver enhances with no focal
abnormality and no ductal dilatation is seen. No calcified
gallstones are seen within the gallbladder. The pancreas is normal
in size and the pancreatic duct is not dilated. Adrenal glands are
unremarkable with a possible small left adrenal adenoma is present.
The spleen is normal in size. The stomach is moderately well
distended with oral contrast media with no abnormality evident. The
kidneys enhance with no calculus or mass and no hydronephrosis is
seen. The abdominal aorta is normal in caliber. No adenopathy is
seen.

The urinary bladder is moderately well distended with no abnormality
noted. The prostate is size. No fluid is seen within the pelvis the
colon is relatively decompressed. The terminal ileum is
unremarkable, as is the appendix. Small groin nodes are present.
There is no evidence of inguinal hernia. No acute bony abnormality
is seen. Small sclerotic foci are scattered throughout the bones of
the pelvis most likely representing benign bone islands in this age
patient. The lumbar vertebrae are in normal alignment with normal
intervertebral disc spaces.
IMPRESSION: 1. No explanation for the patient's pain is seen. The appendix and
terminal ileum are unremarkable.
2. No inguinal hernia is seen. Only small inguinal lymph nodes are
present.
